# Patient Record
Sex: Male | Born: 1960 | Race: White | Hispanic: No | Marital: Married | Smoking: Never smoker
Health system: Southern US, Community
[De-identification: ages and names within clinical notes are randomized; demographics above are authoritative.]

## PROBLEM LIST (undated history)

## (undated) DIAGNOSIS — E119 Type 2 diabetes mellitus without complications: Secondary | ICD-10-CM

## (undated) DIAGNOSIS — I639 Cerebral infarction, unspecified: Secondary | ICD-10-CM

---

## 2006-01-05 ENCOUNTER — Ambulatory Visit: Payer: Self-pay | Admitting: Family Medicine

## 2010-04-26 DIAGNOSIS — I872 Venous insufficiency (chronic) (peripheral): Secondary | ICD-10-CM | POA: Insufficient documentation

## 2012-01-10 DIAGNOSIS — I5032 Chronic diastolic (congestive) heart failure: Secondary | ICD-10-CM | POA: Insufficient documentation

## 2012-08-07 DIAGNOSIS — G4733 Obstructive sleep apnea (adult) (pediatric): Secondary | ICD-10-CM | POA: Insufficient documentation

## 2013-03-15 DIAGNOSIS — Z0000 Encounter for general adult medical examination without abnormal findings: Secondary | ICD-10-CM | POA: Insufficient documentation

## 2013-09-13 ENCOUNTER — Telehealth: Payer: Self-pay | Admitting: *Deleted

## 2013-09-13 MED ORDER — MELOXICAM 15 MG PO TABS: 15.0000 mg | ORAL_TABLET | Freq: Every day | ORAL | Status: DC

## 2013-09-13 NOTE — Telephone Encounter (Signed)
Fax request from [facility] req mobic 15 mg #90 with 4 refills.

## 2013-12-20 DIAGNOSIS — I499 Cardiac arrhythmia, unspecified: Secondary | ICD-10-CM | POA: Insufficient documentation

## 2014-01-05 DIAGNOSIS — L0390 Cellulitis, unspecified: Secondary | ICD-10-CM | POA: Insufficient documentation

## 2014-02-04 DIAGNOSIS — R109 Unspecified abdominal pain: Secondary | ICD-10-CM | POA: Insufficient documentation

## 2014-10-22 DIAGNOSIS — M25561 Pain in right knee: Secondary | ICD-10-CM | POA: Insufficient documentation

## 2015-01-12 DIAGNOSIS — E785 Hyperlipidemia, unspecified: Secondary | ICD-10-CM | POA: Insufficient documentation

## 2015-01-12 DIAGNOSIS — IMO0001 Reserved for inherently not codable concepts without codable children: Secondary | ICD-10-CM | POA: Insufficient documentation

## 2015-09-09 ENCOUNTER — Ambulatory Visit (INDEPENDENT_AMBULATORY_CARE_PROVIDER_SITE_OTHER): Payer: Worker's Compensation

## 2015-09-09 ENCOUNTER — Encounter: Payer: Self-pay | Admitting: Emergency Medicine

## 2015-09-09 ENCOUNTER — Ambulatory Visit
Admission: EM | Admit: 2015-09-09 | Discharge: 2015-09-09 | Disposition: A | Discharge status: Home / Self Care | Payer: Worker's Compensation | Attending: Family Medicine | Admitting: Family Medicine

## 2015-09-09 DIAGNOSIS — M778 Other enthesopathies, not elsewhere classified: Secondary | ICD-10-CM

## 2015-09-09 DIAGNOSIS — M6588 Other synovitis and tenosynovitis, other site: Secondary | ICD-10-CM

## 2015-09-09 DIAGNOSIS — M779 Enthesopathy, unspecified: Principal | ICD-10-CM

## 2015-09-09 HISTORY — DX: Type 2 diabetes mellitus without complications: E11.9

## 2015-09-09 HISTORY — DX: Cerebral infarction, unspecified: I63.9

## 2015-09-09 MED ORDER — HYDROCODONE-ACETAMINOPHEN 5-325 MG PO TABS: ORAL_TABLET | ORAL | Status: DC

## 2015-09-09 NOTE — ED Provider Notes (Signed)
CSN: [id number]     Arrival date & time 09/09/15  1158 History   None    Chief Complaint  Patient presents with  . Arm Pain   (Consider location/radiation/quality/duration/timing/severity/associated sxs/prior Treatment) HPI Comments: 55 yo male presents with a 5 day h/o left wrist pain and mild swelling. Denies any traumatic injury, but states pain started after doing some repetitive motions using a clamp at work  last week. Denies any rash, drainage, fevers, chills.   The history is provided by the patient.    Past Medical History  Diagnosis Date  . Diabetes mellitus without complication (HCC)   . Stroke [facility])    History reviewed. No pertinent past surgical history. Family History  Problem Relation Age of Onset  . Diabetes Mother   . Stroke Sister    Social History  Substance Use Topics  . Smoking status: Never Smoker   . Smokeless tobacco: None  . Alcohol Use: Yes     Comment: occasionally    Review of Systems  Allergies  Review of patient's allergies indicates no known allergies.  Home Medications   Prior to Admission medications   Medication Sig Start Date End Date Taking? Authorizing Provider  aspirin EC 81 MG tablet Take 81 mg by mouth daily.   Yes Historical Provider, MD  carvedilol (COREG) 25 MG tablet Take 25 mg by mouth 2 (two) times daily with a meal.   Yes Historical Provider, MD  furosemide (LASIX) 40 MG tablet Take 40 mg by mouth.   Yes Historical Provider, MD  glipiZIDE (GLUCOTROL) 10 MG tablet Take 10 mg by mouth daily before breakfast.   Yes Historical Provider, MD  hydrochlorothiazide (HYDRODIURIL) 25 MG tablet Take 25 mg by mouth daily.   Yes Historical Provider, MD  lisinopril (PRINIVIL,ZESTRIL) 40 MG tablet Take 40 mg by mouth daily.   Yes Historical Provider, MD  pravastatin (PRAVACHOL) 40 MG tablet Take 40 mg by mouth daily.   Yes Historical Provider, MD  HYDROcodone-acetaminophen (NORCO/VICODIN) 5-325 MG tablet 1-2 tabs po q 8 hours prn 09/09/15    Payton Mccallum, MD  meloxicam (MOBIC) 15 MG tablet Take 1 tablet (15 mg total) by mouth daily. 09/13/13   Max T Al Corpus, DPM   Meds Ordered and Administered this Visit  Medications - No data to display  BP 162/108 mmHg  Pulse 92  Temp(Src) 100.1 F (37.8 C) (Tympanic)  Resp 18  Ht 6\' 1"  (1.854 m)  Wt 500 lb (226.799 kg)  BMI 65.98 kg/m2  SpO2 97% No data found.   Physical Exam  Constitutional: He appears well-developed and well-nourished.  Musculoskeletal:       Left wrist: He exhibits tenderness (over the extensor pollicus tendons) and bony tenderness. He exhibits normal range of motion, no effusion, no crepitus, no deformity and no laceration.  Skin: He is not diaphoretic.  Nursing note and vitals reviewed.   ED Course  Procedures (including critical care time)  Labs Review Labs Reviewed - No data to display  Imaging Review No results found.   Visual Acuity Review  Right Eye Distance:   Left Eye Distance:   Bilateral Distance:    Right Eye Near:   Left Eye Near:    Bilateral Near:         MDM   1. Thumb tendonitis    Discharge Medication List as of 09/09/2015  3:17 PM    START taking these medications   Details  HYDROcodone-acetaminophen (NORCO/VICODIN) 5-325 MG tablet 1-2 tabs po q  8 hours prn, Print       1. Labs/x-ray results and diagnosis reviewed with patient/parent/guardian/family 2. rx as per orders above; reviewed possible side effects, interactions, risks and benefits  3. Recommend supportive treatment with otc NSAIDS, rest; work restriction 4. Follow-up prn if symptoms worsen or don't improve    Payton Mccallumrlando Dorina Ribaudo, MD 09/23/15 1906

## 2015-09-09 NOTE — ED Notes (Signed)
Patient states his left wrist started hurting last Friday, is swollen and painful today, he feels like maybe using a clamp on Wednesday of last week irritated it.

## 2015-09-09 NOTE — Discharge Instructions (Signed)
Tendinitis and Tenosynovitis  Tendinitis is inflammation of the tendon. Tenosynovitis is inflammation of the lining around the tendon (tendon sheath). These painful conditions often occur at once. Tendons attach muscle to bone. To move a limb, force from the muscle moves through the tendon, to the bone. These conditions often cause increased pain when moving. Tendinitis may be caused by a small or partial tear in the tendon.  SYMPTOMS   Pain, tenderness, redness, bruising, or swelling at the injury.  Loss of normal joint movement.  Pain that gets worse with use of the muscle and joint attached to the tendon.  Weakness in the tendon, caused by calcium build up that may occur with tendinitis.  Commonly affected tendons:  Achilles tendon (calf of leg).  Rotator cuff (shoulder joint).  Patellar tendon (kneecap to shin).  Peroneal tendon (ankle).  Posterior tibial tendon (inner ankle).  Biceps tendon (in front of shoulder). CAUSES   Sudden strain on a flexed muscle, muscle overuse, sudden increase or change in activity, vigorous activity.  Result of a direct hit (less common).  Poor muscle action (biomechanics). RISK INCREASES WITH:  Injury (trauma).  Too much exercise.  Sudden change in athletic activity.  Incorrect exercise form or technique.  Poor strength and flexibility.  Not warming-up properly before activity.  Returning to activity before healing is complete. PREVENTION   Warm-up and stretch properly before activity.  Maintain physical fitness:  Joint flexibility.  Muscle strength and endurance.  Fitness that increases heart rate.  Learn and use proper exercise techniques.  Use rehabilitation exercises to strengthen weak muscles and tendons.  Ice the tendon after activity, to reduce recurring inflammation.  Wear proper fitting protective equipment for specific tendons, when indicated. PROGNOSIS  When treated properly, can be cured in 6 to 8  weeks. Recovery may take longer, depending on degree of injury.  RELATED COMPLICATIONS   Re-injury or recurring symptoms.  Permanent weakness or joint stiffness, if injury is severe and recovery is not completed.  Delayed healing, if sports are started before healing is complete.  Tearing apart (rupture) of the inflamed tendon. Tendinitis means the tendon is injured and must recover. TREATMENT  Treatment first involves ice, medicine, and rest from aggravating activities. This reduces pain and inflammation. Modifying your activity may be considered to prevent recurring injury. A brace, elastic bandage wrap, splint, cast, or sling may be prescribed to protect the joint for a short period. After that period, strengthening and stretching exercise may help to regain strength and full range of motion. If the condition persists, despite non-surgical treatment, surgery may be recommended to remove the inflamed tendon lining. Corticosteroid injections may be given to reduce inflammation. However, these injections may weaken the tendon and increase your risk for tendon rupture. MEDICATION   If pain medicine is needed, nonsteroidal anti-inflammatory medicines (aspirin and ibuprofen), or other minor pain relievers (acetaminophen), are often recommended.  Do not take pain medicine for 7 days before surgery.  Prescription pain relievers are usually prescribed only after surgery. Use only as directed and only as much as you need.  Ointments applied to the skin may be helpful.  Corticosteroid injections may be given to reduce inflammation. However, this may increase your risk of a tendon rupture. HEAT AND COLD  Cold treatment (icing) relieves pain and reduces inflammation. Cold treatment should be applied for 10 to 15 minutes every 2 to 3 hours, and immediately after activity that aggravates your symptoms. Use ice packs or an ice massage.  Heat  treatment may be used before performing stretching and  strengthening activities prescribed by your caregiver, physical therapist, or athletic trainer. Use a heat pack or a warm water soak. SEEK MEDICAL CARE IF:   Symptoms get worse or do not improve, despite treatment.  Pain becomes too much to tolerate.  You develop numbness or tingling.  Toes become cold, or toenails become blue, gray, or dark colored.  New, unexplained symptoms develop. (Drugs used in treatment may produce side effects.)   This information is not intended to replace advice given to you by your health care provider. Make sure you discuss any questions you have with your health care provider.   Document Released: 08/15/2005 Document Revised: 11/07/2011 Document Reviewed: 11/27/2008 Elsevier Interactive Patient Education 2016 Elsevier Inc. Lollie Sailse Quervain Disease Lollie Sailse Quervain disease is inflammation of the tendon on the thumb side of the wrist. Tendons are cords of tissue that connect bones to muscles. The tendons in your hand pass through a tunnel, or sheath. A slippery layer of tissue (synovium) lets the tendons move smoothly in the sheath. With de Quervain disease, the sheath swells or thickens, causing friction and pain. The condition is also called de Quervain tendinosis and de Quervain syndrome. It occurs most often in women who are 330-53 years old. CAUSES  The exact cause of de Quervain disease is not known. It may result from:   Overusing your hands, especially with repetitive motions that involve twisting your hand or using a forceful grip.  Pregnancy.  Rheumatoid disease. RISK FACTORS You may have a greater risk for de Quervain disease if you:  Are a middle-aged woman.  Are pregnant.  Have rheumatoid arthritis.  Have diabetes.  Use your hands far more than normal, especially with a tight grip or excessive twisting. SIGNS AND SYMPTOMS Pain on the thumb side of your wrist is the main symptom of de Quervain disease. Other signs and symptoms include:  Pain  that gets worse when you grasp something or turn your wrist.  Pain that extends up the forearm.  Cysts in the area of the pain.  Swelling of your wrist and hand.  A sensation of snapping in the wrist.  Trouble moving the thumb and wrist. DIAGNOSIS  Your health care provider may diagnose de Quervain disease based on your signs and symptoms. A physical exam will also be done. A simple test Lourena Simmonds(Finkelstein test) that involves pulling your thumb and wrist to see if this causes pain can help determine whether you have the condition. Sometimes you may need to have an X-ray.  TREATMENT  Avoiding any activity that causes pain and swelling is the best treatment. Other options include:  Wearing a splint.  Taking medicine. Anti-inflammatory medicines and corticosteroid injections may reduce inflammation and relieve pain.  Having surgery if other treatments do not work. HOME CARE INSTRUCTIONS   Using ice can be helpful after doing activities that involve the sore wrist. To apply ice to the injured area:  Put ice in a plastic bag.  Place a towel between your skin and the bag.  Leave the ice on for 20 minutes, 2-3 times a day.  Take medicines only as directed by your health care provider.  Wear your splint as directed. This will allow your hand to rest and heal. SEEK MEDICAL CARE IF:   Your pain medicine does not help.   Your pain gets worse.  You develop new symptoms. MAKE SURE YOU:   Understand these instructions.  Will watch your condition.  Will get help right away if you are not doing well or get worse.   This information is not intended to replace advice given to you by your health care provider. Make sure you discuss any questions you have with your health care provider.   Document Released: 05/10/2001 Document Revised: 09/05/2014 Document Reviewed: 12/18/2013 Elsevier Interactive Patient Education [employer]

## 2016-06-30 DIAGNOSIS — N182 Chronic kidney disease, stage 2 (mild): Secondary | ICD-10-CM | POA: Insufficient documentation

## 2017-08-21 IMAGING — CR DG WRIST COMPLETE 3+V*L*
4 series · 4 of 4 positions shown · non-contrast
Comparison: None.

CLINICAL DATA: Pain on the radial side of the left wrist for 5-6
days. Pain began after grasping and object. Initial encounter.

EXAM:
LEFT WRIST - COMPLETE 3+ VIEW

[wrist pa (1 of 2)]
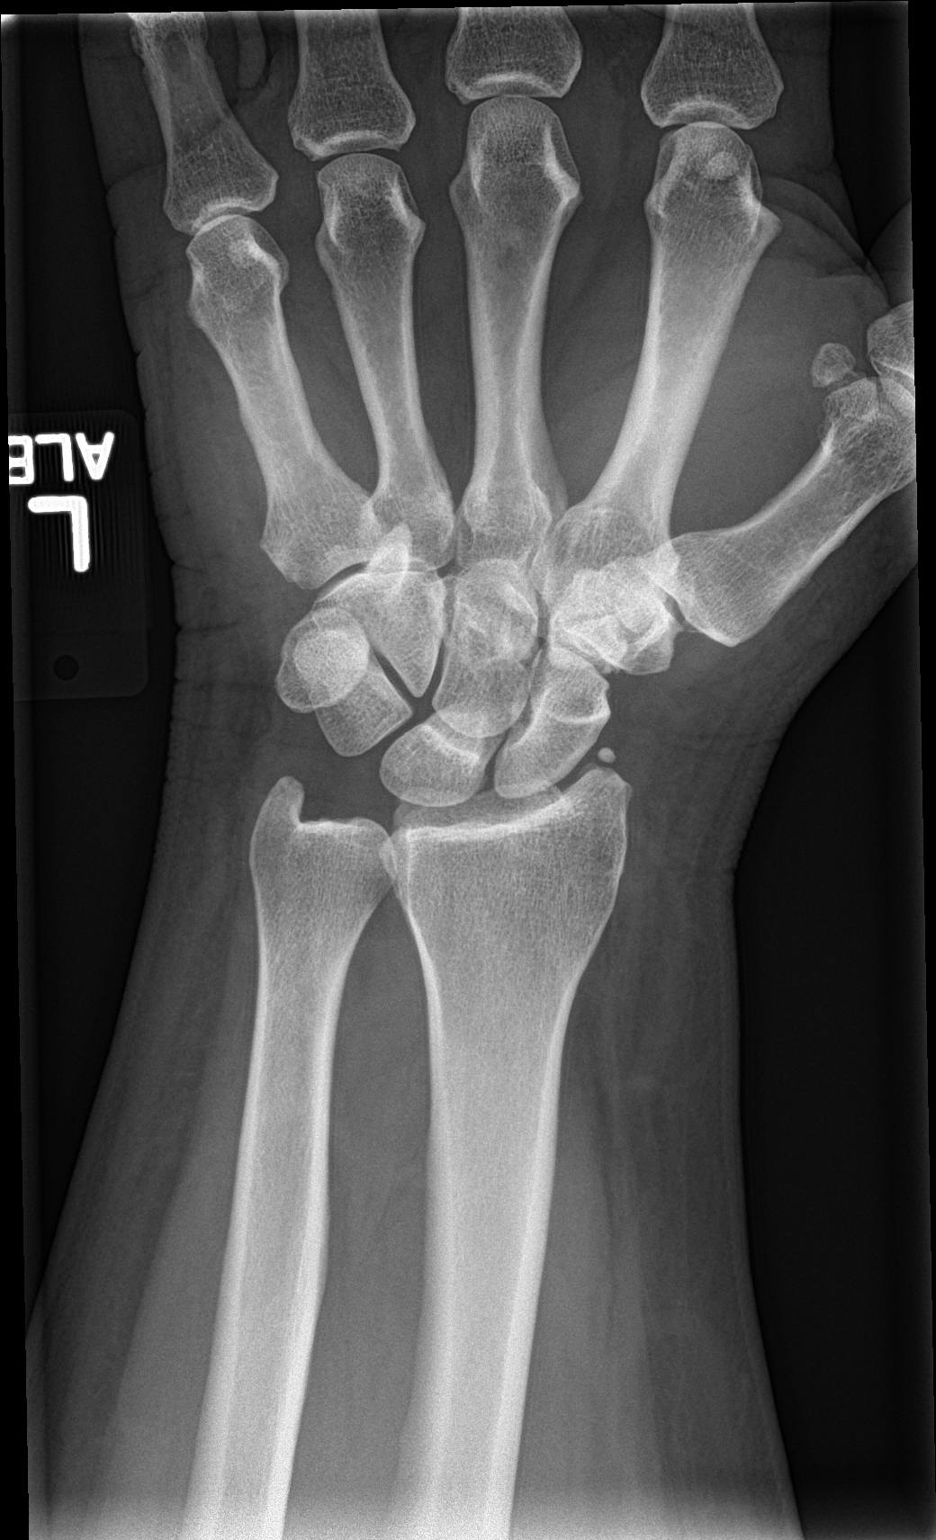

[wrist obl]
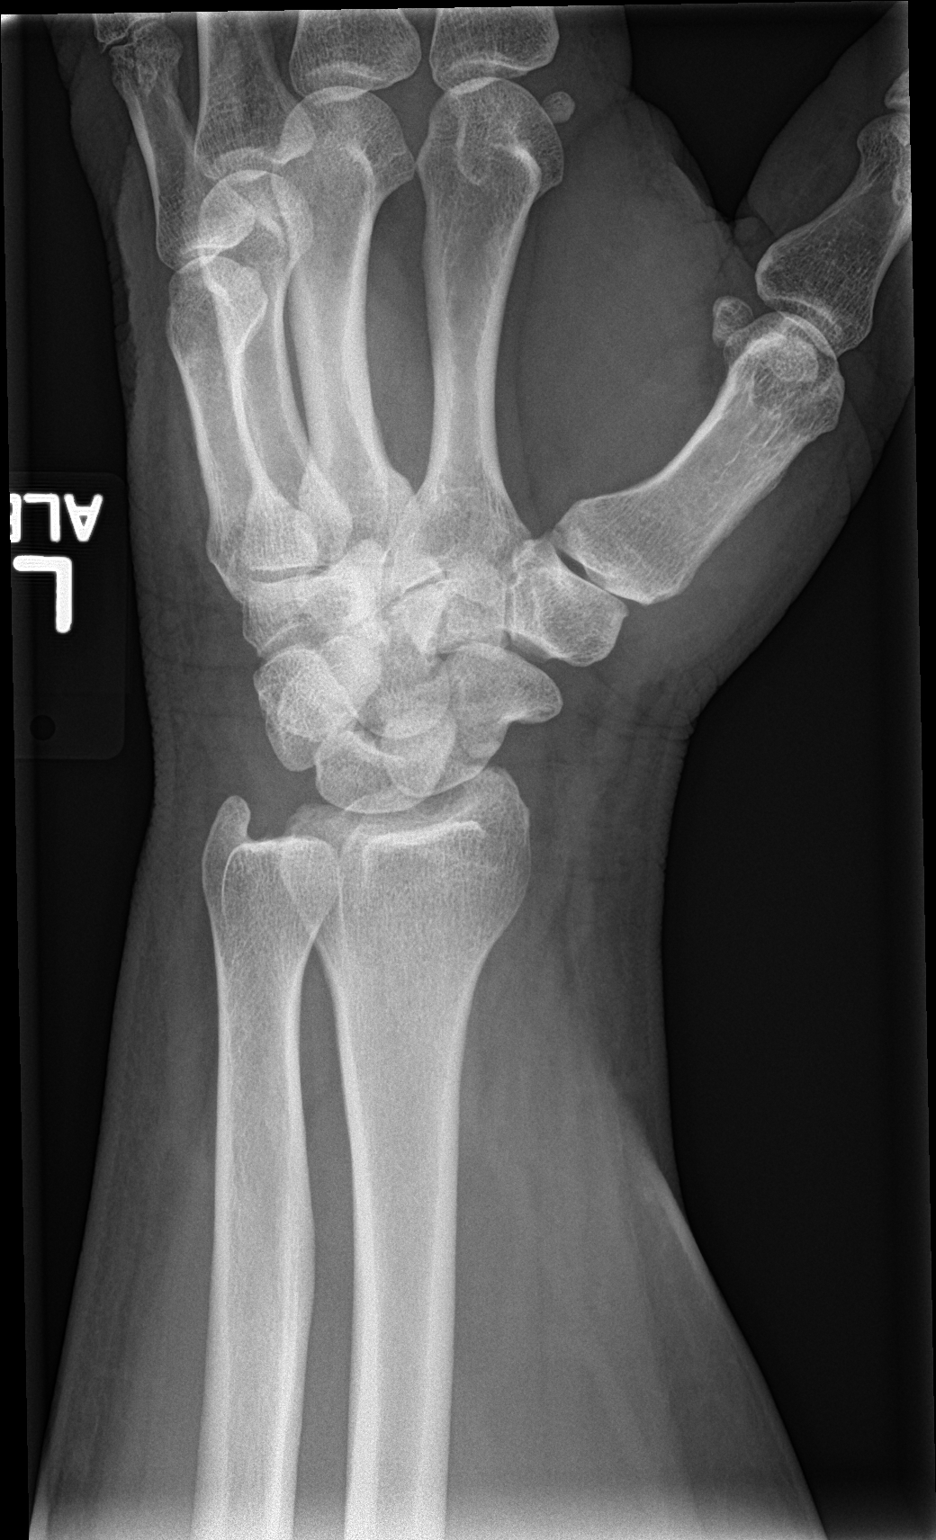

[wrist lat]
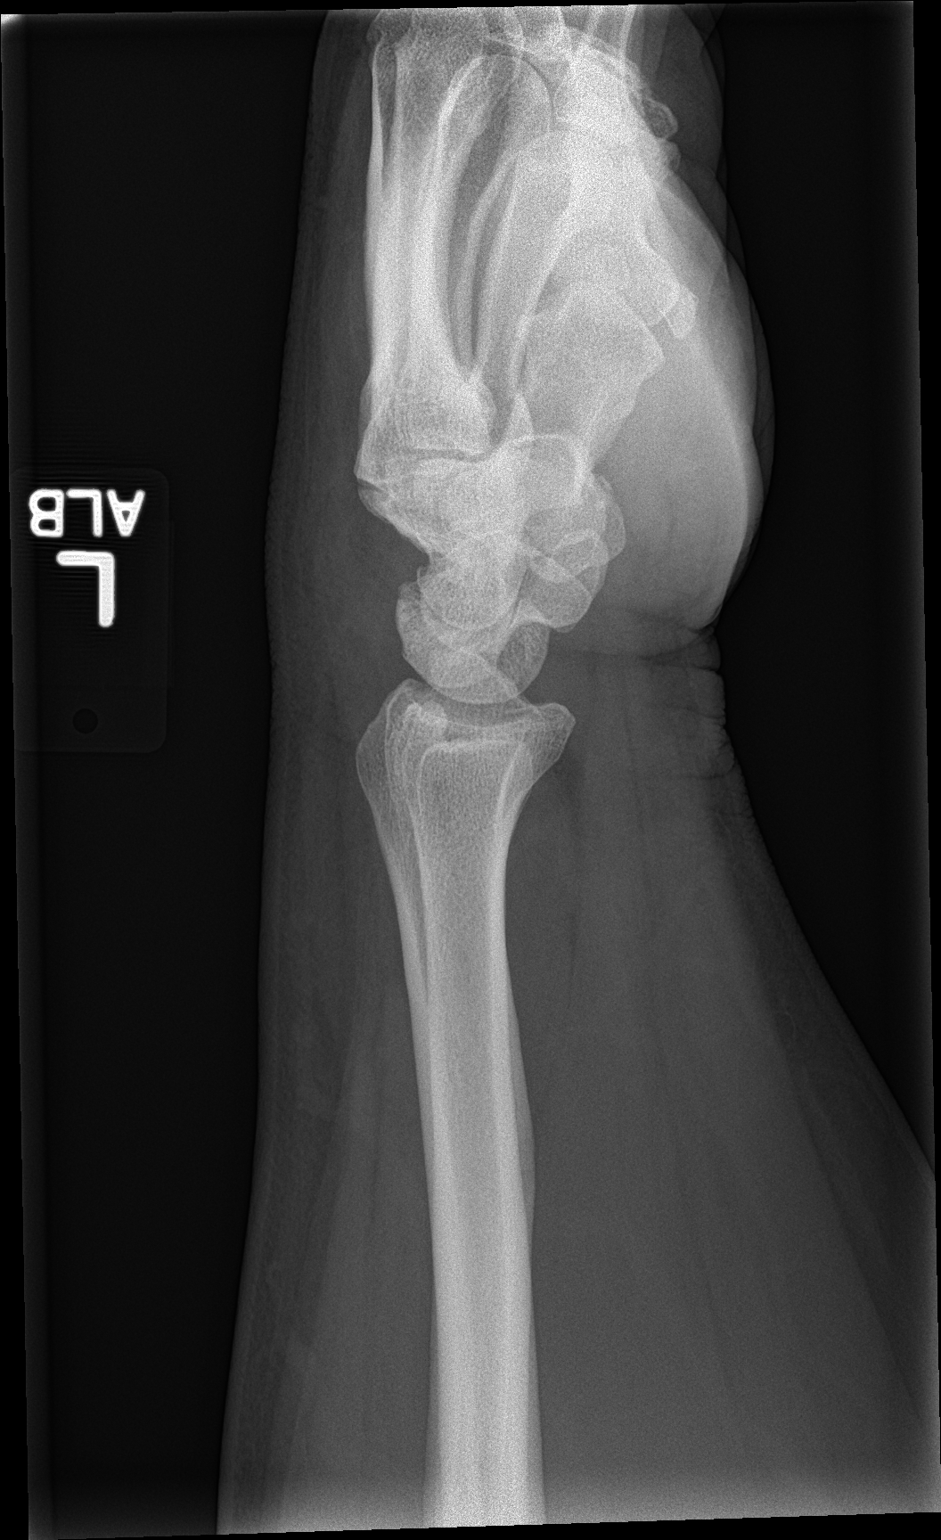

[wrist pa (2 of 2)]
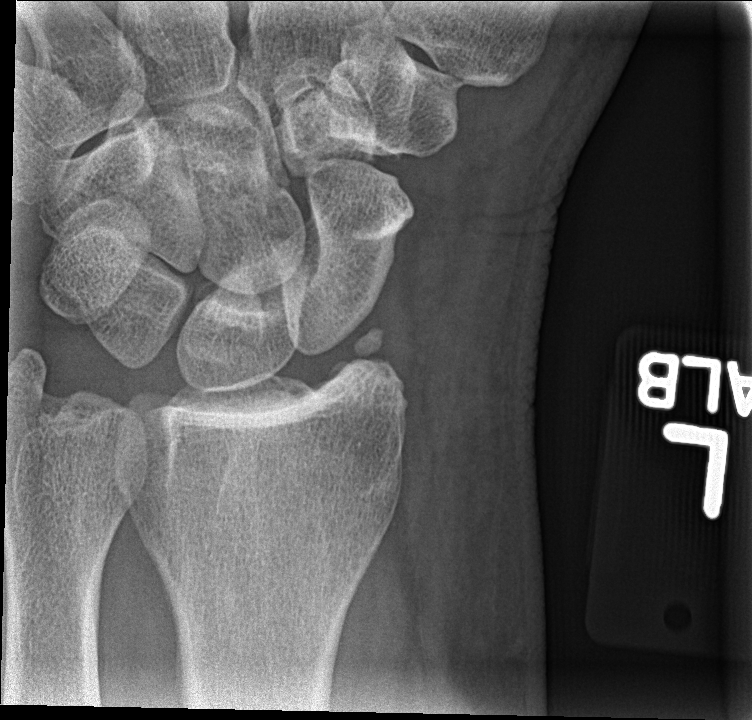

[4 of 4 positions shown; findings below may reference images not displayed]

FINDINGS: No acute bony or joint abnormality is identified. No notable
arthropathy is seen. Small accessory ossicle off the ulnar styloid
is incidentally noted. Soft tissues are unremarkable.
IMPRESSION: Negative exam.

## 2017-09-08 DIAGNOSIS — N1330 Unspecified hydronephrosis: Secondary | ICD-10-CM | POA: Insufficient documentation

## 2017-09-08 DIAGNOSIS — N200 Calculus of kidney: Secondary | ICD-10-CM | POA: Insufficient documentation

## 2017-10-20 DIAGNOSIS — Z01818 Encounter for other preprocedural examination: Secondary | ICD-10-CM | POA: Insufficient documentation

## 2018-09-20 ENCOUNTER — Ambulatory Visit (INDEPENDENT_AMBULATORY_CARE_PROVIDER_SITE_OTHER): Payer: Managed Care, Other (non HMO) | Admitting: Podiatry

## 2018-09-20 ENCOUNTER — Ambulatory Visit (INDEPENDENT_AMBULATORY_CARE_PROVIDER_SITE_OTHER): Payer: Managed Care, Other (non HMO)

## 2018-09-20 ENCOUNTER — Encounter: Payer: Self-pay | Admitting: Podiatry

## 2018-09-20 VITALS — BP 156/96 | HR 60 | Resp 16

## 2018-09-20 DIAGNOSIS — L97511 Non-pressure chronic ulcer of other part of right foot limited to breakdown of skin: Secondary | ICD-10-CM

## 2018-09-20 DIAGNOSIS — E668 Other obesity: Secondary | ICD-10-CM | POA: Insufficient documentation

## 2018-09-20 MED ORDER — DOXYCYCLINE HYCLATE 100 MG PO TABS: 100.0000 mg | ORAL_TABLET | Freq: Two times a day (BID) | ORAL | 0 refills | Status: AC

## 2018-09-20 NOTE — Progress Notes (Signed)
  Subjective:  Patient ID: Justin Bradshaw, male    DOB: 1961/07/17,  MRN: [id number] HPI Chief Complaint  Patient presents with  . Toe Pain    Hallux right - callused cracked wound plantarly x couple weeks, red and swollen, lymphedema right lower extremity, D/C compression stockings - put pressure on toe, tried tea tree oil - no help  . New Patient (Initial Visit)    58 y.o. male presents with the above complaint.   ROS: Denies fever chills nausea vomiting muscle aches pains calf pain back pain chest pain shortness of breath.  Past Medical History:  Diagnosis Date  . Diabetes mellitus without complication (HCC)   . Stroke [facility])    No past surgical history on file.  Current Outpatient Medications:  .  aspirin EC 81 MG tablet, Take 81 mg by mouth daily., Disp: , Rfl:  .  carvedilol (COREG) 25 MG tablet, Take 25 mg by mouth 2 (two) times daily with a meal., Disp: , Rfl:  .  doxycycline (VIBRA-TABS) 100 MG tablet, Take 1 tablet (100 mg total) by mouth 2 (two) times daily., Disp: 20 tablet, Rfl: 0 .  lisinopril (PRINIVIL,ZESTRIL) 40 MG tablet, Take 40 mg by mouth daily., Disp: , Rfl:  .  pravastatin (PRAVACHOL) 40 MG tablet, Take 40 mg by mouth daily., Disp: , Rfl:   No Known Allergies Review of Systems Objective:   Vitals:   09/20/18 0903  BP: (!) 156/96  Pulse: 60  Resp: 16    General: Well developed, nourished, in no acute distress, alert and oriented x3   Dermatological: Skin is warm, dry and supple bilateral. Nails x 10 are well maintained; remaining integument appears unremarkable at this time. There are no open sores, no preulcerative lesions, no rash or signs of infection present.  Fissure transecting longitudinally along the plantar aspect of the hallux right.  There was a thick callus with dried blood beneath it once debrided does demonstrate ulceration which was debrided sharply today but does not probe deep to bone.  Does not probe to subcutaneous tissue.  Vascular:  Dorsalis Pedis artery and Posterior Tibial artery pedal pulses are 2/4 bilateral with immedate capillary fill time. Pedal hair growth present. No varicosities and no lower extremity edema present bilateral.  Severe lymphedema right lower extremity  Neruologic: Grossly intact via light touch bilateral. Vibratory intact via tuning fork bilateral. Protective threshold with Semmes Wienstein monofilament intact to all pedal sites bilateral. Patellar and Achilles deep tendon reflexes 2+ bilateral. No Babinski or clonus noted bilateral.   Musculoskeletal: No gross boney pedal deformities bilateral. No pain, crepitus, or limitation noted with foot and ankle range of motion bilateral. Muscular strength 5/5 in all groups tested bilateral.  Gait: Unassisted, Nonantalgic.    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  Nothing that appears to be bony breakdown in the region of the soft tissue lesion.  Assessment & Plan:   Assessment: Ulceration plantar aspect of the forefoot right with severe lymphedema to the right lower extremity.  Moderate cellulitis first toe.  Plan: Discussed etiology pathology and surgical therapies considerable debridement of the soft tissue lesion and ulceration.  No purulence no malodor started him on doxycycline provided him with Iodosorb gel.  Put him in a Darco shoe he will soak the toe every other day Epsom salts warm water and dressed the toe.      T. [place], [place]

## 2018-10-03 ENCOUNTER — Ambulatory Visit (INDEPENDENT_AMBULATORY_CARE_PROVIDER_SITE_OTHER): Payer: Managed Care, Other (non HMO) | Admitting: Podiatry

## 2018-10-03 ENCOUNTER — Encounter: Payer: Self-pay | Admitting: Podiatry

## 2018-10-03 DIAGNOSIS — I890 Lymphedema, not elsewhere classified: Secondary | ICD-10-CM

## 2018-10-03 DIAGNOSIS — L97511 Non-pressure chronic ulcer of other part of right foot limited to breakdown of skin: Secondary | ICD-10-CM

## 2018-10-03 NOTE — Progress Notes (Signed)
He presents today for follow-up follow-up of the ulceration of the hallux right.  Objective: Much improved no erythema just some mild superficial skin breakdown.  This appears to be healing very nicely I see no signs of infection.  Assessment: Severe lymphedema resulting in ulcer and skin breakdown right hallux.  Plan: Continue conservative therapies we will send him to the lymphedema clinic at [facility].  He will continue his current therapies finish up his antibiotics I will follow-up with him in a couple of weeks.

## 2018-10-09 ENCOUNTER — Ambulatory Visit: Payer: Managed Care, Other (non HMO) | Admitting: Podiatry

## 2018-10-17 ENCOUNTER — Ambulatory Visit (INDEPENDENT_AMBULATORY_CARE_PROVIDER_SITE_OTHER): Payer: Managed Care, Other (non HMO) | Admitting: Podiatry

## 2018-10-17 ENCOUNTER — Encounter: Payer: Self-pay | Admitting: Podiatry

## 2018-10-17 DIAGNOSIS — L97511 Non-pressure chronic ulcer of other part of right foot limited to breakdown of skin: Secondary | ICD-10-CM | POA: Diagnosis not present

## 2018-10-17 DIAGNOSIS — L03115 Cellulitis of right lower limb: Secondary | ICD-10-CM

## 2018-10-17 MED ORDER — AMOXICILLIN-POT CLAVULANATE 875-125 MG PO TABS: 1.0000 | ORAL_TABLET | Freq: Two times a day (BID) | ORAL | 0 refills | Status: AC

## 2018-10-17 NOTE — Progress Notes (Signed)
He presents today for follow-up of ulceration right hallux and lymphedema states that he has not been to the clinic lymphedema clinic yet and has not seen vascular yet.  He states that he does have an arrhythmia so he has been dealing with that first.  Objective: Vital signs are stable he is alert oriented x3 severe lymphedema right lower extremity hallux right does demonstrate some cellulitis and reactive hyper keratoma to the plantar aspect of the hallux.  This is resulting in the skin breakdown and cellulitis.  Assessment: Lymphedema peripheral vascular disease chronic ulceration with cellulitis hallux right.  Plan: Debrided the area again today placed him on Augmentin and I will follow-up with him in a couple of weeks once he has completed his rounds with cardiology and the lymphedema clinic.

## 2018-10-29 ENCOUNTER — Telehealth: Payer: Self-pay | Admitting: Podiatry

## 2018-10-29 NOTE — Telephone Encounter (Signed)
Dr. Al Corpus, this is just an FYI.Marland Kitchen

## 2018-10-29 NOTE — Telephone Encounter (Signed)
Patient wanted to make sure that you are aware that he has been admitted to [facility], with Cellulitis.

## 2018-10-31 ENCOUNTER — Ambulatory Visit: Payer: Managed Care, Other (non HMO) | Admitting: Podiatry

## 2019-11-18 ENCOUNTER — Ambulatory Visit: Payer: Managed Care, Other (non HMO) | Attending: Internal Medicine

## 2019-11-18 DIAGNOSIS — Z23 Encounter for immunization: Secondary | ICD-10-CM

## 2019-11-18 NOTE — Progress Notes (Signed)
   Covid-19 Vaccination Clinic  Name:  Justin Bradshaw    MRN: [id number] DOB: 08/05/1961  11/18/2019  [name] was observed post Covid-19 immunization for 15 minutes without incident. He was provided with Vaccine Information Sheet and instruction to access the V-Safe system.   Mr. Situ was instructed to call 911 with any severe reactions post vaccine: Marland Kitchen Difficulty breathing  . Swelling of face and throat  . A fast heartbeat  . A bad rash all over body  . Dizziness and weakness   Immunizations Administered    Name Date Dose VIS Date Route   Pfizer COVID-19 Vaccine 11/18/2019  3:39 PM 0.3 mL 08/09/2019 Intramuscular   Manufacturer: [employer], [employer]   Lot: [id number]   NDC: [address]-6

## 2019-12-09 ENCOUNTER — Ambulatory Visit: Payer: Managed Care, Other (non HMO) | Attending: Internal Medicine

## 2019-12-09 DIAGNOSIS — Z23 Encounter for immunization: Secondary | ICD-10-CM

## 2019-12-09 NOTE — Progress Notes (Signed)
   Covid-19 Vaccination Clinic  Name:  Justin Bradshaw    MRN: [id number] DOB: [birth year]  12/09/2019  Justin Bradshaw was observed post Covid-19 immunization for 15 minutes without incident. He was provided with Vaccine Information Sheet and instruction to access the V-Safe system.   Justin Bradshaw was instructed to call 911 with any severe reactions post vaccine: Marland Kitchen Difficulty breathing  . Swelling of face and throat  . A fast heartbeat  . A bad rash all over body  . Dizziness and weakness   Immunizations Administered    Name Date Dose VIS Date Route   Pfizer COVID-19 Vaccine 12/09/2019  3:27 PM 0.3 mL 08/09/2019 Intramuscular   Manufacturer: [employer], [employer]   Lot: [id number]   NDC: [address]-4
